# Patient Record
Sex: Female | Born: 1970 | Race: White | Hispanic: No | Marital: Married | Smoking: Never smoker
Health system: Southern US, Community
[De-identification: ages and names within clinical notes are randomized; demographics above are authoritative.]

---

## 2007-03-17 ENCOUNTER — Ambulatory Visit: Payer: Self-pay | Admitting: Gastroenterology

## 2007-04-15 ENCOUNTER — Ambulatory Visit: Payer: Self-pay | Admitting: General Surgery

## 2007-04-15 ENCOUNTER — Other Ambulatory Visit: Payer: Self-pay

## 2007-04-18 ENCOUNTER — Ambulatory Visit: Payer: Self-pay | Admitting: General Surgery

## 2007-07-29 IMAGING — NM NUCLEAR MEDICINE HEPATOHBILIARY INCLUDE GB
2 series · 12 of 12 positions shown · non-contrast
Comparison: none

REASON FOR EXAM: LUQ/epigastric pain, nausea and vomiting
COMMENTS:

[Series 1000: gallbladder statics · 4.80mm/px · 11 of 11 slices shown (1 of 2)]
[im 1/11]
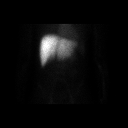
[im 2/11]
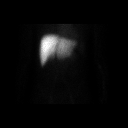
[im 3/11]
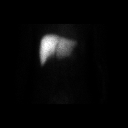
[im 4/11]
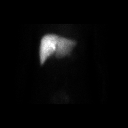
[im 5/11]
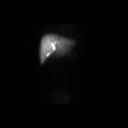
[im 6/11]
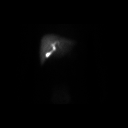
[im 7/11]
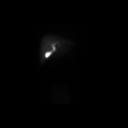
[im 8/11]
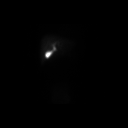
[im 9/11]
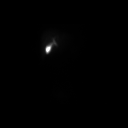
[im 10/11]
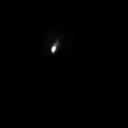
[im 11/11]
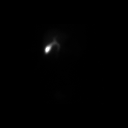

[Series 1000: gallbladder statics · 4.80mm/px · 1 of 1 slices shown (2 of 2)]
[im 1/1]
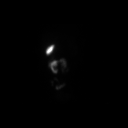

[12 of 12 positions shown; findings below may reference images not displayed]

PROCEDURE:     NM  - NM HEPATOBILIARY IMAGE  - March 17, 2007 [DATE]

RESULT:     The patient received 8.14 mCi Technetium 99m labeled Choletec.

There is adequate uptake of the radiopharmaceutical by the liver. The
gallbladder and intrahepatic ducts and portions of the common bile duct are
visible by 20 minutes. At 60 minutes, no definite bowel activity was seen.
On a four hour delayed image there is good bowel activity but there remains
a moderate amount of activity within the gallbladder.
IMPRESSION: There are no findings to suggest cystic duct obstruction or
common bile duct obstruction but there may be gallbladder dysfunction as
might be seen with chronic cholecystitis. The patient was felt to have
polyps or non-shadowing/non-mobile stones on ultrasound on this same day.

## 2007-08-30 IMAGING — CR DG CHOLANGIOGRAM OPERATIVE
1 series · 1 of 1 positions shown · non-contrast
Comparison: none

REASON FOR EXAM: Post-op
COMMENTS:

[view not recorded]
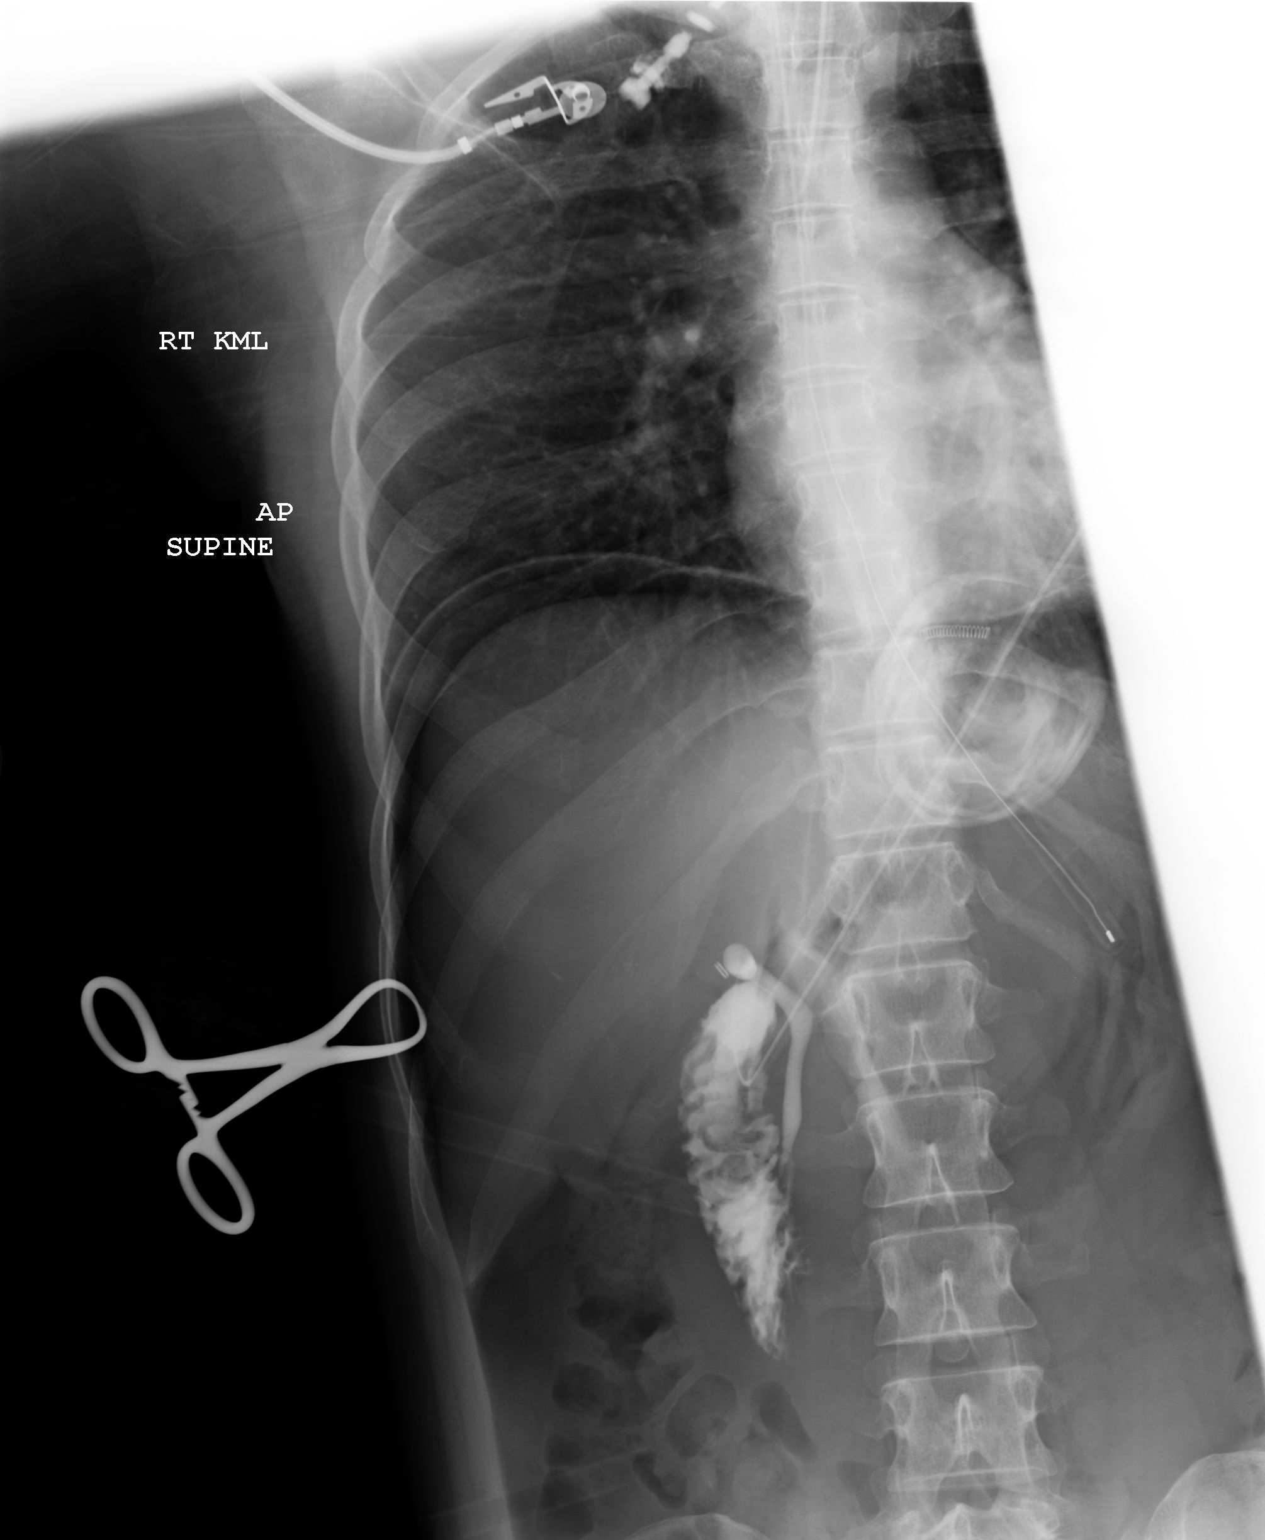

[1 of 1 positions shown; findings below may reference images not displayed]

PROCEDURE:     DXR - DXR CHOLANGIOGRAM OP (INITIAL)  - April 18, 2007  [DATE]

RESULT:     Contrast is visualized in the common duct. The hepatic ducts are
not visualized adequately for evaluation on this exam. There is no
dilatation of the common duct. No retained stone is observed. Contrast is
noted to flow into the duodenum without evidence of obstruction.
IMPRESSION: Normal operative cholangiogram.

## 2016-01-11 ENCOUNTER — Other Ambulatory Visit: Payer: Self-pay | Admitting: Obstetrics and Gynecology

## 2016-01-11 DIAGNOSIS — Z1239 Encounter for other screening for malignant neoplasm of breast: Secondary | ICD-10-CM

## 2017-05-08 ENCOUNTER — Other Ambulatory Visit: Payer: Self-pay | Admitting: Obstetrics and Gynecology

## 2017-05-08 DIAGNOSIS — Z1239 Encounter for other screening for malignant neoplasm of breast: Secondary | ICD-10-CM

## 2017-06-18 ENCOUNTER — Ambulatory Visit
Admission: RE | Admit: 2017-06-18 | Discharge: 2017-06-18 | Disposition: A | Discharge status: Home / Self Care | Payer: BLUE CROSS/BLUE SHIELD | Source: Ambulatory Visit | Attending: Obstetrics and Gynecology | Admitting: Obstetrics and Gynecology

## 2017-06-18 DIAGNOSIS — Z1231 Encounter for screening mammogram for malignant neoplasm of breast: Secondary | ICD-10-CM | POA: Diagnosis not present

## 2017-06-18 DIAGNOSIS — Z1239 Encounter for other screening for malignant neoplasm of breast: Secondary | ICD-10-CM

## 2018-07-02 ENCOUNTER — Other Ambulatory Visit: Payer: Self-pay | Admitting: Obstetrics and Gynecology

## 2018-07-02 DIAGNOSIS — Z1231 Encounter for screening mammogram for malignant neoplasm of breast: Secondary | ICD-10-CM

## 2019-08-04 ENCOUNTER — Other Ambulatory Visit: Payer: Self-pay | Admitting: Obstetrics and Gynecology

## 2019-08-04 DIAGNOSIS — Z1231 Encounter for screening mammogram for malignant neoplasm of breast: Secondary | ICD-10-CM

## 2022-08-02 ENCOUNTER — Emergency Department: Payer: BC Managed Care – PPO

## 2022-08-02 ENCOUNTER — Emergency Department
Admission: EM | Admit: 2022-08-02 | Discharge: 2022-08-02 | Disposition: A | Discharge status: Home / Self Care | Payer: BC Managed Care – PPO | Attending: Emergency Medicine | Admitting: Emergency Medicine

## 2022-08-02 ENCOUNTER — Encounter: Payer: Self-pay | Admitting: *Deleted

## 2022-08-02 ENCOUNTER — Other Ambulatory Visit: Payer: Self-pay

## 2022-08-02 DIAGNOSIS — N202 Calculus of kidney with calculus of ureter: Secondary | ICD-10-CM | POA: Diagnosis not present

## 2022-08-02 DIAGNOSIS — N200 Calculus of kidney: Secondary | ICD-10-CM

## 2022-08-02 DIAGNOSIS — R350 Frequency of micturition: Secondary | ICD-10-CM | POA: Diagnosis present

## 2022-08-02 LAB — URINALYSIS, ROUTINE W REFLEX MICROSCOPIC
Bacteria, UA: NONE SEEN
Bilirubin Urine: NEGATIVE
Glucose, UA: NEGATIVE mg/dL
Ketones, ur: NEGATIVE mg/dL
Leukocytes,Ua: NEGATIVE
Nitrite: NEGATIVE
Protein, ur: 30 mg/dL — AB
RBC / HPF: >50 RBC/hpf — ABNORMAL HIGH (ref 0–5)
Specific Gravity, Urine: 1.017 (ref 1.005–1.030)
pH: 5 (ref 5.0–8.0)

## 2022-08-02 MED ORDER — ONDANSETRON 4 MG PO TBDP: 4.0000 mg | ORAL_TABLET | Freq: Three times a day (TID) | ORAL | 0 refills | Status: AC | PRN

## 2022-08-02 MED ORDER — OXYCODONE-ACETAMINOPHEN 5-325 MG PO TABS: 1.0000 | ORAL_TABLET | ORAL | 0 refills | Status: AC | PRN

## 2022-08-02 MED ORDER — TAMSULOSIN HCL 0.4 MG PO CAPS: 0.4000 mg | ORAL_CAPSULE | Freq: Every day | ORAL | 0 refills | Status: AC

## 2022-08-02 MED ORDER — SODIUM CHLORIDE 0.9 % IV BOLUS
1000.0000 mL | Freq: Once | INTRAVENOUS | Status: AC
  Administered 2022-08-02: 1000 mL via INTRAVENOUS

## 2022-08-02 MED ORDER — KETOROLAC TROMETHAMINE 30 MG/ML IJ SOLN
15.0000 mg | Freq: Once | INTRAMUSCULAR | Status: AC
  Administered 2022-08-02: 15 mg via INTRAVENOUS
  Filled 2022-08-02: qty 1

## 2022-08-02 MED ORDER — MORPHINE SULFATE (PF) 4 MG/ML IV SOLN
4.0000 mg | Freq: Once | INTRAVENOUS | Status: AC
  Administered 2022-08-02: 4 mg via INTRAVENOUS
  Filled 2022-08-02: qty 1

## 2022-08-02 MED ORDER — KETOROLAC TROMETHAMINE 10 MG PO TABS: 10.0000 mg | ORAL_TABLET | Freq: Four times a day (QID) | ORAL | 0 refills | Status: AC | PRN

## 2022-08-02 MED ORDER — ONDANSETRON HCL 4 MG/2ML IJ SOLN
4.0000 mg | Freq: Once | INTRAMUSCULAR | Status: AC
  Administered 2022-08-02: 4 mg via INTRAVENOUS
  Filled 2022-08-02: qty 2

## 2022-08-02 NOTE — ED Provider Notes (Signed)
[facility] Provider Note    Event Date/Time   First MD Initiated Contact with Patient 08/02/22 1841     (approximate)   History   Urinary Frequency   HPI  Joan Russell is a 51 y.o. female with no significant past medical history presents to the emergency department complaining of right-sided flank pain.  Some pressure with urination.  Patient's had symptoms for 1 day.  No history of kidney stones.  No vomiting or diarrhea.  States pain does radiate across top of her back.      Physical Exam   Triage Vital Signs: ED Triage Vitals  Enc Vitals Group     BP 08/02/22 1720 (!) 154/100     Pulse Rate 08/02/22 1720 79     Resp 08/02/22 1720 18     Temp 08/02/22 1720 98.5 F (36.9 C)     Temp Source 08/02/22 1720 Oral     SpO2 08/02/22 1720 99 %     Weight 08/02/22 1718 100 lb (45.4 kg)     Height 08/02/22 1718 5\' 1"  (1.549 m)     Head Circumference --      Peak Flow --      Pain Score 08/02/22 1718 8     Pain Loc --      Pain Edu? --      Excl. in [place]? --     Most recent vital signs: Vitals:   08/02/22 1720  BP: (!) 154/100  Pulse: 79  Resp: 18  Temp: 98.5 F (36.9 C)  SpO2: 99%     General: Awake, no distress.   CV:  Good peripheral perfusion. regular rate and  rhythm Resp:  Normal effort. Lungs CTA Abd:  No distention.  Nontender Other:      ED Results / Procedures / Treatments   Labs (all labs ordered are listed, but only abnormal results are displayed) Labs Reviewed  URINALYSIS, ROUTINE W REFLEX MICROSCOPIC - Abnormal; Notable for the following components:      Result Value   Color, Urine YELLOW (*)    APPearance HAZY (*)    Hgb urine dipstick LARGE (*)    Protein, ur 30 (*)    RBC / HPF >50 (*)    All other components within normal limits     EKG     RADIOLOGY CT renal stone    PROCEDURES:   Procedures   MEDICATIONS ORDERED IN ED: Medications  sodium chloride 0.9 % bolus 1,000 mL (1,000 mLs  Intravenous New Bag/Given 08/02/22 1936)  morphine (PF) 4 MG/ML injection 4 mg (4 mg Intravenous Given 08/02/22 1935)  ondansetron (ZOFRAN) injection 4 mg (4 mg Intravenous Given 08/02/22 1934)     IMPRESSION / MDM / ASSESSMENT AND PLAN / ED COURSE  I reviewed the triage vital signs and the nursing notes.                              Differential diagnosis includes, but is not limited to, kidney stone, UTI, pyelonephritis, muscle strain  Patient's presentation is most consistent with acute complicated illness / injury requiring diagnostic workup.   Patient's urinalysis is concerning as it has RBCs, concern for kidney stone with this finding.  CT renal stone independently reviewed and interpreted by me as having a kidney stone.  Radiologist states it is 4 mm in the right distal ureter  Patient was given normal saline 1 L IV,  morphine 4 mg IV Zofran 4 mg IV and Toradol 30 mg IV.  At discharge she will be given Toradol, pain medication, Zofran for nausea/vomiting, and tamsulosin.  Since there are no WBCs I do not feel that the patient needs an antibiotic.  Does not appear to be an infected kidney stone.  Patient also be given a strainer to hopefully catch stone.  She is to follow-up with urology      FINAL CLINICAL IMPRESSION(S) / ED DIAGNOSES   Final diagnoses:  Kidney stone     Rx / DC Orders   ED Discharge Orders          Ordered    ketorolac (TORADOL) 10 MG tablet  Every 6 hours PRN        08/02/22 2000    tamsulosin (FLOMAX) 0.4 MG CAPS capsule  Daily        08/02/22 2000    oxyCODONE-acetaminophen (PERCOCET) 5-325 MG tablet  Every 4 hours PRN        08/02/22 2000    ondansetron (ZOFRAN-ODT) 4 MG disintegrating tablet  Every 8 hours PRN        08/02/22 2000             Note:  This document was prepared using Dragon voice recognition software and may include unintentional dictation errors.    Versie Starks, PA-C 08/02/22 2348    Lucillie Garfinkel, MD 08/03/22  [phone]

## 2022-08-02 NOTE — ED Notes (Signed)
E signature pad not working. Pt educated on discharge instructions and verbalized understanding.  

## 2022-08-02 NOTE — ED Triage Notes (Signed)
Pt has right side back pain and pressure feeling with urination.  Pt has urinary frequency.  Sx for 1 day.  No hx of kidney stones  pt alert  speech clear.

## 2022-08-02 NOTE — ED Notes (Signed)
ED Provider at bedside. 

## 2022-08-02 NOTE — Discharge Instructions (Addendum)
Follow-up with Dr. Alexander Bergeron, please call for an appointment for recheck for next week.  Take your medications as prescribed.  Drink plenty of water.  Return to the emergency department if you are worsening

## 2022-08-07 ENCOUNTER — Other Ambulatory Visit: Payer: Self-pay | Admitting: *Deleted

## 2022-08-07 ENCOUNTER — Encounter: Payer: Self-pay | Admitting: Urology

## 2022-08-07 ENCOUNTER — Ambulatory Visit: Payer: BC Managed Care – PPO | Admitting: Urology

## 2022-08-07 ENCOUNTER — Ambulatory Visit
Admission: RE | Admit: 2022-08-07 | Discharge: 2022-08-07 | Disposition: A | Discharge status: Home / Self Care | Payer: BC Managed Care – PPO | Source: Ambulatory Visit | Attending: Urology | Admitting: Urology

## 2022-08-07 ENCOUNTER — Ambulatory Visit
Admission: RE | Admit: 2022-08-07 | Discharge: 2022-08-07 | Disposition: A | Discharge status: Home / Self Care | Payer: BC Managed Care – PPO | Attending: Urology | Admitting: Urology

## 2022-08-07 VITALS — BP 150/86 | HR 77 | Ht 61.0 in | Wt 101.0 lb

## 2022-08-07 DIAGNOSIS — N200 Calculus of kidney: Secondary | ICD-10-CM

## 2022-08-07 DIAGNOSIS — Z87442 Personal history of urinary calculi: Secondary | ICD-10-CM | POA: Diagnosis not present

## 2022-08-07 NOTE — Patient Instructions (Signed)
Dietary Guidelines to Help Prevent Kidney Stones Kidney stones are deposits of minerals and salts that form inside your kidneys. Your risk of developing kidney stones may be greater depending on your diet, your lifestyle, the medicines you take, and whether you have certain medical conditions. Most people can lower their risks of developing kidney stones by following these dietary guidelines. Your dietitian may give you more specific instructions depending on your overall health and the type of kidney stones you tend to develop. What are tips for following this plan? Reading food labels  Choose foods with "no salt added" or "low-salt" labels. Limit your salt (sodium) intake to less than 1,500 mg a day. Choose foods with calcium for each meal and snack. Try to eat about 300 mg of calcium at each meal. Foods that contain 200-500 mg of calcium a serving include: 8 oz (237 mL) of milk, calcium-fortifiednon-dairy milk, and calcium-fortifiedfruit juice. Calcium-fortified means that calcium has been added to these drinks. 8 oz (237 mL) of kefir, yogurt, and soy yogurt. 4 oz (114 g) of tofu. 1 oz (28 g) of cheese. 1 cup (150 g) of dried figs. 1 cup (91 g) of cooked broccoli. One 3 oz (85 g) can of sardines or mackerel. Most people need 1,000-1,500 mg of calcium a day. Talk to your dietitian about how much calcium is recommended for you. Shopping Buy plenty of fresh fruits and vegetables. Most people do not need to avoid fruits and vegetables, even if these foods contain nutrients that may contribute to kidney stones. When shopping for convenience foods, choose: Whole pieces of fruit. Pre-made salads with dressing on the side. Low-fat fruit and yogurt smoothies. Avoid buying frozen meals or prepared deli foods. These can be high in sodium. Look for foods with live cultures, such as yogurt and kefir. Choose high-fiber grains, such as whole-wheat breads, oat bran, and wheat cereals. Cooking Do not add  salt to food when cooking. Place a salt shaker on the table and allow each person to add their own salt to taste. Use vegetable protein, such as beans, textured vegetable protein (TVP), or tofu, instead of meat in pasta, casseroles, and soups. Meal planning Eat less salt, if told by your dietitian. To do this: Avoid eating processed or pre-made food. Avoid eating fast food. Eat less animal protein, including cheese, meat, poultry, or fish, if told by your dietitian. To do this: Limit the number of times you have meat, poultry, fish, or cheese each week. Eat a diet free of meat at least 2 days a week. Eat only one serving each day of meat, poultry, fish, or seafood. When you prepare animal proteins, cut pieces into small portion sizes. For most meat and fish, one serving is about the size of the palm of your hand. Eat at least five servings of fresh fruits and vegetables each day. To do this: Keep fruits and vegetables on hand for snacks. Eat one piece of fruit or a handful of berries with breakfast. Have a salad and fruit at lunch. Have two kinds of vegetables at dinner. You may be told to limit foods that are high in a substance called oxalate. These include: Spinach (cooked), rhubarb, beets, sweet potatoes, and Swiss chard. Peanuts. Potato chips, french fries, and baked potatoes with skin on. Nuts and nut products. Chocolate. If you regularly take a diuretic medicine, make sure to eat at least 1 or 2 servings of fruits or vegetables that are high in potassium each day. These include: Avocado.   Banana. Orange, prune, carrot, or tomato juice. Baked potato. Cabbage. Beans and split peas. Lifestyle  Drink enough fluid to keep your urine pale yellow. This is the most important thing you can do. Spread your fluid intake throughout the day. If you drink alcohol: Limit how much you have to: 0-1 drink a day for women who are not pregnant. 0-2 drinks a day for men. Know how much alcohol is  in your drink. In the [place] one drink equals one 12 oz bottle of beer (355 mL), one 5 oz glass of wine (148 mL), or one 1 oz glass of hard liquor (44 mL). Lose weight if told by your health care provider. Work with your dietitian to find an eating plan and weight loss strategies that work best for you. General information Talk to your health care provider and dietitian about taking daily supplements. Depending on your health and the cause of your kidney stones, you may be told: Do not take high-dose supplements of vitamin C (1,000 mg a day or more). To take a calcium supplement. To take a daily probiotic supplement. To take other supplements such as magnesium, fish oil, or vitamin B6. Take over-the-counter and prescription medicines only as told by your health care provider. These include supplements. What foods should I limit? Limit your intake of the following foods, or eat them as told by your dietitian. Vegetables Spinach. Rhubarb. Beets. Canned vegetables. Pickles. Olives. Baked potatoes with skin. Grains Wheat bran. Baked goods. Salted crackers. Cereals high in sugar. Meats and other proteins Nuts. Nut butters. Large portions of meat, poultry, or fish. Salted, precooked, or cured meats, such as sausages, meat loaves, and hot dogs. Dairy Cheeses. Beverages Regular soft drinks. Regular vegetable juice. Seasonings and condiments Seasoning blends with salt. Salad dressings. Soy sauce. Ketchup. Barbecue sauce. Other foods Canned soups. Canned pasta sauce. Casseroles. Pizza. Lasagna. Frozen meals. Potato chips. French fries. The items listed above may not be a complete list of foods and beverages you should limit. Contact a dietitian for more information. What foods should I avoid? Talk to your dietitian about specific foods you should avoid based on the type of kidney stones you have and your overall health. Fruits Grapefruit. The item listed above may not be a complete list of foods  and beverages you should avoid. Contact a dietitian for more information. Summary Kidney stones are deposits of minerals and salts that form inside your kidneys. You can lower your risk of kidney stones by making changes to your diet. The most important thing you can do is drink enough fluid. Drink enough fluid to keep your urine pale yellow. Talk to your dietitian about how much calcium you should have each day, and eat less salt and animal protein as told by your dietitian. This information is not intended to replace advice given to you by your health care provider. Make sure you discuss any questions you have with your health care provider. Document Revised: 12/28/2021 Document Reviewed: 12/28/2021 Elsevier Patient Education  2023 Elsevier Inc.  Kidney Stones  Kidney stones are solid, rock-like deposits that form inside of the kidneys. The kidneys are a pair of organs that make urine. A kidney stone may form in a kidney and move into other parts of the urinary tract, including the tubes that connect the kidneys to the bladder (ureters), the bladder, and the tube that carries urine out of the body (urethra). As the stone moves through these areas, it can cause intense pain and block the   flow of urine. Kidney stones are created when high levels of certain minerals are found in the urine. The stones are usually passed out of the body through urination, but in some cases, medical treatment may be needed to remove them. What are the causes? Kidney stones may be caused by: A condition in which certain glands produce too much parathyroid hormone (primary hyperparathyroidism), which causes too much calcium buildup in the blood. A buildup of uric acid crystals in the bladder (hyperuricosuria). Uric acid is a chemical that the body produces when you eat certain foods. It usually leaves the body in the urine. Narrowing (stricture) of one or both of the ureters. A kidney blockage that is present at birth  (congenital obstruction). Past surgery on the kidney or the ureters. What increases the risk? The following factors may make you more likely to develop this condition: Having had a kidney stone in the past. Having a family history of kidney stones. Not drinking enough water. Eating a diet that is high in protein, salt (sodium), or sugar. Being overweight or obese. What are the signs or symptoms? Symptoms of a kidney stone may include: Pain in the side of the abdomen, right below the ribs (flank pain). Pain usually spreads (radiates) to the groin. Needing to urinate often or urgently. Painful urination. Blood in the urine (hematuria). Nausea. Vomiting. Fever and chills. How is this diagnosed? This condition may be diagnosed based on: Your symptoms and medical history. A physical exam. Blood tests. Urine tests. These may be done before and after the stone passes out of your body through urination. Imaging tests, such as a CT scan, abdominal X-ray, or ultrasound. A procedure to examine the inside of the bladder (cystoscopy). How is this treated? Treatment for kidney stones depends on the size, location, and makeup of the stones. Kidney stones will often pass out of the body through urination. You may need to: Increase your fluid intake to help pass the stone. In some cases, you may be given fluids through an IV and may need to be monitored in the hospital. Take medicine for pain. Make changes in your diet to help prevent kidney stones from coming back. Sometimes, procedures are needed to remove a kidney stone. This may involve: A procedure to break up kidney stones using: A focused beam of light (laser therapy). Shock waves (extracorporeal shock wave lithotripsy). Surgery to remove kidney stones. This may be needed if you have severe pain or have stones that block your urinary tract. Follow these instructions at home: Medicines Take over-the-counter and prescription medicines only  as told by your health care provider. Ask your health care provider if the medicine prescribed to you requires you to avoid driving or using heavy machinery. Eating and drinking Drink enough fluid to keep your urine pale yellow. You may be instructed to drink at least 8-10 glasses of water each day. This will help you pass the kidney stone. If directed, change your diet. This may include: Limiting how much sodium you eat. Eating more fruits and vegetables. Limiting how much animal protein you eat. Animal proteins include red meat, poultry, fish, and eggs. Eating a normal amount of calcium (1,000-1,300 mg per day). Follow instructions from your health care provider about eating or drinking restrictions. General instructions Collect urine samples as told by your health care provider. You may need to collect a urine sample: 24 hours after you pass the stone. 8-12 weeks after you pass the kidney stone, and every 6-12 months after   that. Strain your urine every time you urinate, for as long as directed. Use the strainer that your health care provider recommends. Do not throw out the kidney stone after passing it. Keep the stone so it can be tested by your health care provider. Testing the makeup of your kidney stone may help prevent you from getting kidney stones in the future. Keep all follow-up visits. You may need follow-up X-rays or ultrasounds to make sure that your stone has passed. How is this prevented? To prevent another kidney stone: Drink enough fluid to keep your urine pale yellow. This is the best way to prevent kidney stones. Eat a healthy diet. Follow recommendations from your health care provider about foods to avoid. Recommendations vary depending on the type of kidney stone that you have. You may be instructed to eat a low-protein diet. Maintain a healthy weight. Where to find more information National Kidney Foundation (NKF): www.kidney.org [facility] (UCF):  www.urologyhealth.org Contact a health care provider if: You have pain that gets worse or does not get better with medicine. Get help right away if: You have a fever or chills. You develop severe pain. You develop new abdominal pain. You faint. You are unable to urinate. Summary Kidney stones are solid, rock-like deposits that form inside of the kidneys. Kidney stones can cause nausea, vomiting, blood in the urine, abdominal pain, and the urge to urinate often. Treatment for kidney stones depends on the size, location, and makeup of the stones. Kidney stones will often pass out of the body through urination. Kidney stones can be prevented by drinking enough fluids, eating a healthy diet, and maintaining a healthy weight. This information is not intended to replace advice given to you by your health care provider. Make sure you discuss any questions you have with your health care provider. Document Revised: 12/27/2021 Document Reviewed: 12/27/2021 Elsevier Patient Education  2023 Elsevier Inc.  

## 2022-08-07 NOTE — Progress Notes (Signed)
   08/07/22 10:32 AM   Joan Russell [birth year] [id number]  CC: Right ureteral stone  HPI: Healthy 51 year old female who presented to the ER on 08/02/2022 with right-sided flank pain and some urinary symptoms.  Work-up showed a 4 mm noninfected right distal ureteral stone, with no other renal stones.  She was discharged on medical expulsive therapy her pain has since resolved, and she saw a stone pass a few days after leaving the hospital.  She denies any definite prior stone events.     Social History:  reports that she has never smoked. She has never been exposed to tobacco smoke. She has never used smokeless tobacco. She reports current alcohol use. She reports that she does not use drugs.  Physical Exam: BP (!) 150/86   Pulse 77   Ht 5\' 1"  (1.549 m)   Wt 101 lb (45.8 kg)   LMP 05/20/2017 (Within Days)   BMI 19.08 kg/m    Constitutional:  Alert and oriented, No acute distress. Cardiovascular: No clubbing, cyanosis, or edema. Respiratory: Normal respiratory effort, no increased work of breathing. GI: Abdomen is soft, nontender, nondistended, no abdominal masses  Laboratory Data: Reviewed  Pertinent Imaging: I have personally viewed and interpreted the CT as well as the KUB today.  CT with a 4 mm right distal ureteral stone, KUB today with phleboliths in the pelvis, but no definite ureteral stone.  Assessment & Plan:   51 year old female who presented with right-sided flank pain was found to have a 4 mm right distal ureteral stone with no other renal stones.  She saw a stone pass a few days later with complete resolution of her symptoms.  We discussed general stone prevention strategies including adequate hydration with goal of producing 2.5 L of urine daily, increasing citric acid intake, increasing calcium intake during high oxalate meals, minimizing animal protein, and decreasing salt intake. Information about dietary recommendations given today.   Follow-up with urology  as needed  Nickolas Madrid, MD 08/07/2022  [facility] [address] [place], [place] [address] [phone]

## 2022-11-20 ENCOUNTER — Other Ambulatory Visit: Payer: Self-pay | Admitting: Obstetrics and Gynecology

## 2022-11-20 DIAGNOSIS — Z1231 Encounter for screening mammogram for malignant neoplasm of breast: Secondary | ICD-10-CM
